# Patient Record
Sex: Male | Born: 1965 | Race: White | Hispanic: No | Marital: Married | Smoking: Never smoker
Health system: Southern US, Community
[De-identification: ages and names within clinical notes are randomized; demographics above are authoritative.]

## PROBLEM LIST (undated history)

## (undated) DIAGNOSIS — L719 Rosacea, unspecified: Secondary | ICD-10-CM

## (undated) DIAGNOSIS — E7800 Pure hypercholesterolemia, unspecified: Secondary | ICD-10-CM

## (undated) DIAGNOSIS — R002 Palpitations: Secondary | ICD-10-CM

## (undated) DIAGNOSIS — C44609 Unspecified malignant neoplasm of skin of left upper limb, including shoulder: Secondary | ICD-10-CM

## (undated) DIAGNOSIS — G4730 Sleep apnea, unspecified: Secondary | ICD-10-CM

## (undated) DIAGNOSIS — L649 Androgenic alopecia, unspecified: Secondary | ICD-10-CM

## (undated) DIAGNOSIS — J309 Allergic rhinitis, unspecified: Secondary | ICD-10-CM

## (undated) DIAGNOSIS — G4700 Insomnia, unspecified: Secondary | ICD-10-CM

## (undated) DIAGNOSIS — Z8669 Personal history of other diseases of the nervous system and sense organs: Secondary | ICD-10-CM

## (undated) DIAGNOSIS — M5412 Radiculopathy, cervical region: Secondary | ICD-10-CM

## (undated) DIAGNOSIS — R410 Disorientation, unspecified: Secondary | ICD-10-CM

## (undated) DIAGNOSIS — J302 Other seasonal allergic rhinitis: Secondary | ICD-10-CM

## (undated) HISTORY — DX: Radiculopathy, cervical region: M54.12

## (undated) HISTORY — DX: Other seasonal allergic rhinitis: J30.2

## (undated) HISTORY — DX: Palpitations: R00.2

## (undated) HISTORY — PX: COLONOSCOPY: SHX174

## (undated) HISTORY — DX: Allergic rhinitis, unspecified: J30.9

## (undated) HISTORY — DX: Sleep apnea, unspecified: G47.30

## (undated) HISTORY — DX: Personal history of other diseases of the nervous system and sense organs: Z86.69

## (undated) HISTORY — DX: Insomnia, unspecified: G47.00

## (undated) HISTORY — DX: Rosacea, unspecified: L71.9

## (undated) HISTORY — DX: Disorientation, unspecified: R41.0

## (undated) HISTORY — DX: Unspecified malignant neoplasm of skin of left upper limb, including shoulder: C44.609

## (undated) HISTORY — PX: MELANOMA EXCISION: SHX5266

## (undated) HISTORY — DX: Androgenic alopecia, unspecified: L64.9

## (undated) HISTORY — DX: Pure hypercholesterolemia, unspecified: E78.00

---

## 1986-10-06 HISTORY — PX: OTHER SURGICAL HISTORY: SHX169

## 2002-10-06 HISTORY — PX: LIPOMA EXCISION: SHX5283

## 2016-04-18 ENCOUNTER — Ambulatory Visit
Admission: RE | Admit: 2016-04-18 | Discharge: 2016-04-18 | Disposition: A | Discharge status: Home / Self Care | Payer: 59 | Source: Ambulatory Visit | Attending: Family Medicine | Admitting: Family Medicine

## 2016-04-18 ENCOUNTER — Other Ambulatory Visit: Payer: Self-pay | Admitting: Family Medicine

## 2016-04-18 DIAGNOSIS — M5442 Lumbago with sciatica, left side: Secondary | ICD-10-CM

## 2016-04-18 DIAGNOSIS — M79602 Pain in left arm: Secondary | ICD-10-CM

## 2017-09-05 DIAGNOSIS — C44609 Unspecified malignant neoplasm of skin of left upper limb, including shoulder: Secondary | ICD-10-CM

## 2017-09-05 HISTORY — DX: Unspecified malignant neoplasm of skin of left upper limb, including shoulder: C44.609

## 2017-10-06 HISTORY — PX: COLONOSCOPY: SHX174

## 2017-10-14 ENCOUNTER — Encounter: Payer: Self-pay | Admitting: Internal Medicine

## 2017-12-11 ENCOUNTER — Ambulatory Visit (AMBULATORY_SURGERY_CENTER): Payer: Self-pay

## 2017-12-11 ENCOUNTER — Other Ambulatory Visit: Payer: Self-pay

## 2017-12-11 VITALS — Ht 67.0 in | Wt 173.4 lb

## 2017-12-11 DIAGNOSIS — Z1211 Encounter for screening for malignant neoplasm of colon: Secondary | ICD-10-CM

## 2017-12-11 NOTE — Progress Notes (Signed)
Denies allergies to eggs or soy products. Denies complication of anesthesia or sedation. Denies use of weight loss medication. Denies use of O2.   Emmi instructions declined.  

## 2017-12-14 ENCOUNTER — Encounter: Payer: Self-pay | Admitting: Internal Medicine

## 2017-12-25 ENCOUNTER — Encounter: Payer: Self-pay | Admitting: Internal Medicine

## 2017-12-25 ENCOUNTER — Other Ambulatory Visit: Payer: Self-pay

## 2017-12-25 ENCOUNTER — Ambulatory Visit (AMBULATORY_SURGERY_CENTER): Payer: 59 | Admitting: Internal Medicine

## 2017-12-25 VITALS — BP 114/71 | HR 65 | Temp 96.9°F | Resp 11 | Ht 67.0 in | Wt 173.0 lb

## 2017-12-25 DIAGNOSIS — Z800 Family history of malignant neoplasm of digestive organs: Secondary | ICD-10-CM | POA: Diagnosis present

## 2017-12-25 DIAGNOSIS — Z1211 Encounter for screening for malignant neoplasm of colon: Secondary | ICD-10-CM

## 2017-12-25 MED ORDER — SODIUM CHLORIDE 0.9 % IV SOLN: 500.0000 mL | Freq: Once | INTRAVENOUS | Status: DC

## 2017-12-25 NOTE — Progress Notes (Signed)
To recovery, report to RN, VSS. 

## 2017-12-25 NOTE — Op Note (Signed)
[facility] Patient Name: Justin Curtis Procedure Date: 12/25/2017 1:32 PM MRN: [id number] Endoscopist: Gatha Mayer , MD Age: 52 Referring MD:  Date of Birth: 12-29-1965 Gender: Male Account #: [id number] Procedure:                Colonoscopy Indications:              Screening in patient at increased risk: Colorectal                            cancer in father 62 or older, and maternal                            grandfather >60 Medicines:                Propofol per Anesthesia, Monitored Anesthesia Care Procedure:                Pre-Anesthesia Assessment:                           - Prior to the procedure, a History and Physical                            was performed, and patient medications and                            allergies were reviewed. The patient's tolerance of                            previous anesthesia was also reviewed. The risks                            and benefits of the procedure and the sedation                            options and risks were discussed with the patient.                            All questions were answered, and informed consent                            was obtained. Prior Anticoagulants: The patient has                            taken no previous anticoagulant or antiplatelet                            agents. ASA Grade Assessment: II - A patient with                            mild systemic disease. After reviewing the risks                            and benefits, the patient was deemed in  satisfactory condition to undergo the procedure.                           After obtaining informed consent, the colonoscope                            was passed under direct vision. Throughout the                            procedure, the patient's blood pressure, pulse, and                            oxygen saturations were monitored continuously. The                            Colonoscope was introduced  through the anus and                            advanced to the the cecum, identified by                            appendiceal orifice and ileocecal valve. The                            colonoscopy was performed without difficulty. The                            patient tolerated the procedure well. The quality                            of the bowel preparation was excellent. The                            ileocecal valve, appendiceal orifice, and rectum                            were photographed. The bowel preparation used was                            Miralax. Scope In: 1:40:35 PM Scope Out: 1:52:21 PM Scope Withdrawal Time: 0 hours 7 minutes 38 seconds  Total Procedure Duration: 0 hours 11 minutes 46 seconds  Findings:                 The perianal and digital rectal examinations were                            normal. Pertinent negatives include normal prostate                            (size, shape, and consistency).                           The entire examined colon appeared normal on direct  and retroflexion views. Complications:            No immediate complications. Estimated Blood Loss:     Estimated blood loss: none. Impression:               - The entire examined colon is normal on direct and                            retroflexion views.                           - No specimens collected. Recommendation:           - Patient has a contact number available for                            emergencies. The signs and symptoms of potential                            delayed complications were discussed with the                            patient. Return to normal activities tomorrow.                            Written discharge instructions were provided to the                            patient.                           - Resume previous diet.                           - Continue present medications.                           - Repeat  colonoscopy in 5 years for screening                            purposes. Gatha Mayer, MD 12/25/2017 1:55:59 PM This report has been signed electronically.

## 2017-12-25 NOTE — Progress Notes (Signed)
Pt's states no medical or surgical changes since previsit or office visit. 

## 2017-12-25 NOTE — Patient Instructions (Addendum)
   Normal colonoscopy again!  Your next routine colonoscopy should be in 5 years - 2024.  I appreciate the opportunity to care for you. Gatha Mayer, MD, FACG   YOU HAD AN ENDOSCOPIC PROCEDURE TODAY AT [facility]:   Refer to the procedure report that was given to you for any specific questions about what was found during the examination.  If the procedure report does not answer your questions, please call your gastroenterologist to clarify.  If you requested that your care partner not be given the details of your procedure findings, then the procedure report has been included in a sealed envelope for you to review at your convenience later.  YOU SHOULD EXPECT: Some feelings of bloating in the abdomen. Passage of more gas than usual.  Walking can help get rid of the air that was put into your GI tract during the procedure and reduce the bloating. If you had a lower endoscopy (such as a colonoscopy or flexible sigmoidoscopy) you may notice spotting of blood in your stool or on the toilet paper. If you underwent a bowel prep for your procedure, you may not have a normal bowel movement for a few days.  Please Note:  You might notice some irritation and congestion in your nose or some drainage.  This is from the oxygen used during your procedure.  There is no need for concern and it should clear up in a day or so.  SYMPTOMS TO REPORT IMMEDIATELY:   Following lower endoscopy (colonoscopy or flexible sigmoidoscopy):  Excessive amounts of blood in the stool  Significant tenderness or worsening of abdominal pains  Swelling of the abdomen that is new, acute  Fever of 100F or higher    For urgent or emergent issues, a gastroenterologist can be reached at any hour by calling [phone].   DIET:  We do recommend a small meal at first, but then you may proceed to your regular diet.  Drink plenty of fluids but you should avoid alcoholic beverages for 24  hours.  ACTIVITY:  You should plan to take it easy for the rest of today and you should NOT DRIVE or use heavy machinery until tomorrow (because of the sedation medicines used during the test).    FOLLOW UP: Our staff will call the number listed on your records the next business day following your procedure to check on you and address any questions or concerns that you may have regarding the information given to you following your procedure. If we do not reach you, we will leave a message.  However, if you are feeling well and you are not experiencing any problems, there is no need to return our call.  We will assume that you have returned to your regular daily activities without incident.  If any biopsies were taken you will be contacted by phone or by letter within the next 1-3 weeks.  Please call us at [phone] if you have not heard about the biopsies in 3 weeks.    SIGNATURES/CONFIDENTIALITY: You and/or your care partner have signed paperwork which will be entered into your electronic medical record.  These signatures attest to the fact that that the information above on your After Visit Summary has been reviewed and is understood.  Full responsibility of the confidentiality of this discharge information lies with you and/or your care-partner.

## 2017-12-28 ENCOUNTER — Telehealth: Payer: Self-pay | Admitting: *Deleted

## 2017-12-28 ENCOUNTER — Telehealth: Payer: Self-pay

## 2017-12-28 NOTE — Telephone Encounter (Signed)
Left message

## 2017-12-28 NOTE — Telephone Encounter (Signed)
No answer for post procedure call back. Will attempt to call back later this afternoon. SM 

## 2020-07-19 ENCOUNTER — Other Ambulatory Visit: Payer: Self-pay

## 2020-07-19 ENCOUNTER — Emergency Department (HOSPITAL_BASED_OUTPATIENT_CLINIC_OR_DEPARTMENT_OTHER)
Admission: EM | Admit: 2020-07-19 | Discharge: 2020-07-19 | Disposition: A | Discharge status: Home / Self Care | Payer: 59 | Attending: Emergency Medicine | Admitting: Emergency Medicine

## 2020-07-19 ENCOUNTER — Encounter (HOSPITAL_BASED_OUTPATIENT_CLINIC_OR_DEPARTMENT_OTHER): Payer: Self-pay | Admitting: *Deleted

## 2020-07-19 DIAGNOSIS — S0181XA Laceration without foreign body of other part of head, initial encounter: Secondary | ICD-10-CM | POA: Diagnosis not present

## 2020-07-19 DIAGNOSIS — S0990XA Unspecified injury of head, initial encounter: Secondary | ICD-10-CM | POA: Diagnosis present

## 2020-07-19 DIAGNOSIS — Z85828 Personal history of other malignant neoplasm of skin: Secondary | ICD-10-CM | POA: Diagnosis not present

## 2020-07-19 DIAGNOSIS — W228XXA Striking against or struck by other objects, initial encounter: Secondary | ICD-10-CM | POA: Insufficient documentation

## 2020-07-19 NOTE — ED Triage Notes (Signed)
Skin avulsion to his forearm this am. Bleeding controlled. His MD wanted him to see if it could be sutured.

## 2020-07-19 NOTE — ED Provider Notes (Signed)
[name] Provider Note   CSN: [id number] Arrival date & time: 07/19/20  1234     History Chief Complaint  Patient presents with  . Laceration    Justin Curtis is a 54 y.o. male.  HPI Is a 54 year old male with a medical history as noted below.  He was seen by his PCP earlier today and was sent to the emergency department for possible sutures.  Patient states that he struck his forehead on the back door of his vehicle.  Reports a small avulsion to the right forehead.  No active bleeding.  Mild pain overlying the site.  No other complaints at this time.    Past Medical History:  Diagnosis Date  . Allergic rhinitis    since his teens  . 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 History of migraine headaches   . Hypercholesterolemia   . Insomnia   . Left cervical radiculopathy    intermittently since 2002  . Palpitations   . Rosacea   . Skin cancer of arm, left 09/2017   tricep area  . Transient disorientation     There are no problems to display for this patient.   Past Surgical History:  Procedure Laterality Date  . COLONOSCOPY    . left ACL repair  1988  . LIPOMA EXCISION  2004   on his back  . MELANOMA EXCISION Left    tricep area       Family History  Problem Relation Age of Onset  . Colon cancer Father 75  . Skin cancer Father   . Alzheimer's disease Father        Possibly   . Colon cancer Maternal Grandfather   . Hypertension Mother   . Gout Mother   . Hyperlipidemia Mother   . Skin cancer Mother   . Other Mother        renovascular disease  . Skin cancer Sister   . Sleep apnea Brother   . Depression Brother   . Esophageal cancer Neg Hx   . Liver cancer Neg Hx   . Pancreatic cancer Neg Hx   . Rectal cancer Neg Hx     Social History   Tobacco Use  . Smoking status: Never Smoker  . Smokeless tobacco: Never Used  Vaping Use  . Vaping Use: Never used  Substance Use Topics  . Alcohol use: Yes    Comment: occasional  wine or beer.  . Drug use: No    Home Medications Prior to Admission medications   Medication Sig Start Date End Date Taking? Authorizing Provider  ibuprofen (ADVIL,MOTRIN) 400 MG tablet Take 400 mg by mouth every 6 (six) hours as needed.    [provider]  loratadine (CLARITIN) 10 MG tablet Take 10 mg by mouth daily.    [provider]  Pseudoephedrine-Ibuprofen 30-200 MG TABS Take 1 tablet by mouth as needed.    [provider]    Allergies    Patient has no known allergies.  Review of Systems   Review of Systems  Skin: Positive for wound.  Neurological: Negative for syncope.  Psychiatric/Behavioral: Negative for confusion.   Physical Exam Updated Vital Signs BP 127/85   Pulse 71   Temp 98.5 F (36.9 C) (Oral)   Resp 16   Ht 5\' 7"  (1.702 m)   Wt 65.8 kg   SpO2 98%   BMI 22.71 kg/m   Physical Exam Vitals and nursing note reviewed.  Constitutional:  General: He is not in acute distress.    Appearance: He is well-developed.  HENT:     Head: Normocephalic and atraumatic.     Right Ear: External ear normal.     Left Ear: External ear normal.  Eyes:     General: No scleral icterus.       Right eye: No discharge.        Left eye: No discharge.     Conjunctiva/sclera: Conjunctivae normal.  Neck:     Trachea: No tracheal deviation.  Cardiovascular:     Rate and Rhythm: Normal rate.  Pulmonary:     Effort: Pulmonary effort is normal. No respiratory distress.     Breath sounds: No stridor.  Abdominal:     General: There is no distension.  Musculoskeletal:        General: No swelling or deformity.     Cervical back: Neck supple.  Skin:    General: Skin is warm and dry.     Findings: Wound present. No rash.     Comments: Small skin avulsion to the right forehead.  No active bleeding.  Neurological:     Mental Status: He is alert.     Cranial Nerves: Cranial nerve deficit: no gross deficits.    ED Results / Procedures /  Treatments   Labs (all labs ordered are listed, but only abnormal results are displayed) Labs Reviewed - No data to display  EKG None  Radiology No results found.  Procedures Procedures (including critical care time)  Medications Ordered in ED Medications - No data to display  ED Course  I have reviewed the triage vital signs and the nursing notes.  Pertinent labs & imaging results that were available during my care of the patient were reviewed by me and considered in my medical decision making (see chart for details).    MDM Rules/Calculators/A&P                          Patient presents today with a small avulsion laceration to the right forehead.  He was seen by his primary care provider earlier today who recommended that he come to the emergency department for possible sutures.  Given the nature of the wound, do not feel that suturing is appropriate.  We will have nursing staff place bacitracin on the site and a fresh bandage.  Discussed tissue adhesive with patient and both he and I do not feel this is necessary.  Discussed wound care with the patient.  He is going to follow-up with his PCP in 1-1/2 weeks at his regularly scheduled appointment.    His questions were answered and he was amicable at the time of discharge.  His vital signs are stable.  Final Clinical Impression(s) / ED Diagnoses Final diagnoses:  Laceration of forehead, initial encounter   Rx / DC Orders ED Discharge Orders    None       Rayna Sexton, PA-C 07/19/20 1419    Quintella Reichert, MD 07/19/20 1441

## 2020-09-03 ENCOUNTER — Encounter (HOSPITAL_BASED_OUTPATIENT_CLINIC_OR_DEPARTMENT_OTHER): Payer: Self-pay

## 2020-09-03 DIAGNOSIS — R400 Somnolence: Secondary | ICD-10-CM

## 2020-10-12 ENCOUNTER — Encounter (HOSPITAL_BASED_OUTPATIENT_CLINIC_OR_DEPARTMENT_OTHER): Payer: 59 | Admitting: Internal Medicine

## 2020-10-17 ENCOUNTER — Ambulatory Visit (HOSPITAL_BASED_OUTPATIENT_CLINIC_OR_DEPARTMENT_OTHER): Payer: 59 | Attending: Internal Medicine | Admitting: Internal Medicine

## 2020-10-17 ENCOUNTER — Other Ambulatory Visit: Payer: Self-pay

## 2020-10-17 DIAGNOSIS — G4733 Obstructive sleep apnea (adult) (pediatric): Secondary | ICD-10-CM | POA: Diagnosis not present

## 2020-10-17 DIAGNOSIS — R400 Somnolence: Secondary | ICD-10-CM

## 2020-10-31 DIAGNOSIS — R400 Somnolence: Secondary | ICD-10-CM | POA: Diagnosis not present

## 2020-10-31 NOTE — Procedures (Signed)
   Patient Name: Justin Curtis, [name] Date: 10/17/2020 Gender: Male D.O.B: September 21, 1966 Age (years): 54 Referring Provider: Derinda Late Height (inches): 60 Interpreting Physician: Baird Lyons MD, ABSM Weight (lbs): 150 RPSGT: Jacolyn Reedy BMI: 23 MRN: [id number] Neck Size: 16.00  CLINICAL INFORMATION Sleep Study Type: HST Indication for sleep study: OSA Epworth Sleepiness Score: 19  SLEEP STUDY TECHNIQUE A multi-channel overnight portable sleep study was performed. The channels recorded were: nasal airflow, thoracic respiratory movement, and oxygen saturation with a pulse oximetry. Snoring was also monitored.  MEDICATIONS Patient self administered medications include: none reported  SLEEP ARCHITECTURE Patient was studied for 432.2 minutes. The sleep efficiency was 100.0 % and the patient was supine for 86.9%. The arousal index was 0.0 per hour.  RESPIRATORY PARAMETERS The overall AHI was 34.0 per hour, with a central apnea index of 0.0 per hour. The oxygen nadir was 83% during sleep.  CARDIAC DATA Mean heart rate during sleep was 73.3 bpm.  IMPRESSIONS - Severe obstructive sleep apnea occurred during this study (AHI = 34.0/h). - No significant central sleep apnea occurred during this study (CAI = 0.0/h). - Oxygen desaturation was noted during this study (Min O2 = 83%). Mean O2 saturation was 94%. - Patient snored.  DIAGNOSIS - Obstructive Sleep Apnea (G47.33)  RECOMMENDATIONS - Suggest CPAP titration sleep study or autopap. Other options would be based on clinical judgment. - Be careful with alcohol, sedatives and other CNS depressants that may worsen sleep apnea and disrupt normal sleep architecture. - Sleep hygiene should be reviewed to assess factors that may improve sleep quality. - Weight management and regular exercise should be initiated or continued.  [Electronically signed] 10/31/2020 01:43 PM  Baird Lyons MD, [place], American Board of  Sleep Medicine   NPI: [id number]                          [place], [place] [facility]  ELECTRONICALLY SIGNED ON:  10/31/2020, 1:41 PM [place] PH: [phone]   FX: [phone] [place]

## 2020-11-07 ENCOUNTER — Institutional Professional Consult (permissible substitution): Payer: 59 | Admitting: Pulmonary Disease

## 2020-12-04 ENCOUNTER — Other Ambulatory Visit: Payer: Self-pay

## 2020-12-04 ENCOUNTER — Encounter: Payer: Self-pay | Admitting: Pulmonary Disease

## 2020-12-04 ENCOUNTER — Ambulatory Visit (INDEPENDENT_AMBULATORY_CARE_PROVIDER_SITE_OTHER): Payer: 59 | Admitting: Pulmonary Disease

## 2020-12-04 DIAGNOSIS — G4733 Obstructive sleep apnea (adult) (pediatric): Secondary | ICD-10-CM | POA: Insufficient documentation

## 2020-12-04 NOTE — Progress Notes (Signed)
Subjective:    Patient ID: Justin Curtis, male    DOB: 1966-01-18, 55 y.o.   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  There are no witnessed apneas.  For the past year and a half he has been taking Xanax 0.5 mg at bedtime to help him with sleep and stress at work.  He tried melatonin in the past which did not work and Ambien seem to last longer and he once got off a plane from [place] and does not remember the ride home.  Epworth sleepiness score is 19 and reports sleepiness while sitting and reading, watching TV, in meetings, as a passenger in a car or lying down to rest in the afternoons. Bedtime is around 11 PM, sleep latency is minutes after he is taking his medication, he sleeps on his right side with 1 pillow, denies nocturnal awakenings and is out of bed by 5 AM feeling rested without dryness of mouth or headaches.  He started exercising and has lost 30 pounds in the last 2 years There is no history suggestive of cataplexy, sleep paralysis or parasomnias  PMH -hyperlipidemia, chronic headaches, allergic rhinitis    Significant tests/ events reviewed  HST 10/2020 TST 7h, supine AHI 38/hour nonsupine AHI 6/hour, total AHI 34/hour   Past Medical History:  Diagnosis Date  . Allergic rhinitis    since his teens  . 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 History of migraine headaches   . Hypercholesterolemia   . Insomnia   . Left cervical radiculopathy    intermittently since 2002  . Palpitations   . Rosacea   . Skin cancer of arm, left 09/2017   tricep area  . Transient disorientation    Past Surgical History:  Procedure Laterality Date  . COLONOSCOPY    . left ACL repair  1988  . LIPOMA EXCISION  2004   on his back  . MELANOMA EXCISION Left    tricep area    No Known Allergies  Social  History   Socioeconomic History  . Marital status: Married    Spouse name: Not on file  . Number of children: Not on file  . Years of education: Not on file  . Highest education level: Not on file  Occupational History  . Not on file  Tobacco Use  . Smoking status: Never Smoker  . Smokeless tobacco: Never Used  Vaping Use  . Vaping Use: Never used  Substance and Sexual Activity  . Alcohol use: Yes    Comment: occasional wine or beer.  . Drug use: No  . Sexual activity: Not on file  Other Topics Concern  . Not on file  Social History Narrative  . Not on file   Social Determinants of Health   Financial Resource Strain: Not on file  Food Insecurity: Not on file  Transportation Needs: Not on file  Physical Activity: Not on file  Stress: Not on file  Social Connections: Not on file  Intimate Partner Violence: Not on file    Family History  Problem Relation Age of Onset  . Colon cancer Father 62  . Skin cancer Father   . Alzheimer's disease Father        Possibly   . Colon cancer Maternal Grandfather   . Hypertension Mother   . Gout  Mother   . Hyperlipidemia Mother   . Skin cancer Mother   . Other Mother        renovascular disease  . Skin cancer Sister   . Sleep apnea Brother   . Depression Brother   . Esophageal cancer Neg Hx   . Liver cancer Neg Hx   . Pancreatic cancer Neg Hx   . Rectal cancer Neg Hx       Review of Systems Constitutional: negative for anorexia, fevers and sweats  Eyes: negative for irritation, redness and visual disturbance  Ears, nose, mouth, throat, and face: negative for earaches, epistaxis, nasal congestion and sore throat  Respiratory: negative for cough, dyspnea on exertion, sputum and wheezing  Cardiovascular: negative for chest pain, dyspnea, lower extremity edema, orthopnea, palpitations and syncope  Gastrointestinal: negative for abdominal pain, constipation, diarrhea, melena, nausea and vomiting  Genitourinary:negative for  dysuria, frequency and hematuria  Hematologic/lymphatic: negative for bleeding, easy bruising and lymphadenopathy  Musculoskeletal:negative for arthralgias, muscle weakness and stiff joints  Neurological: negative for coordination problems, gait problems, headaches and weakness  Endocrine: negative for diabetic symptoms including polydipsia, polyuria and weight loss     Objective:   Physical Exam  Gen. Pleasant, well-nourished, in no distress, normal affect ENT - no pallor,icterus, no post nasal drip, class 2 airway Neck: No JVD, no thyromegaly, no carotid bruits Lungs: no use of accessory muscles, no dullness to percussion, clear without rales or rhonchi  Cardiovascular: Rhythm regular, heart sounds  normal, no murmurs or gallops, no peripheral edema Abdomen: soft and non-tender, no hepatosplenomegaly, BS normal. Musculoskeletal: No deformities, no cyanosis or clubbing Neuro:  alert, non focal       Assessment & Plan:

## 2020-12-04 NOTE — Assessment & Plan Note (Signed)
We discussed that you have severe obstructive sleep apnea in the supine position and very mild on your side -dramatic difference, supine AHI 38/hour, nonsupine AHI 6/hour, total AHI 34/on the study with predominant supine sleep Try to avoid sleeping on your back  You can cut the tennis ball in half and put it under the covers or you can trial a positional device called zzOma  Call me back in 3 months to report. If you are better, then we will proceed with home sleep test with wearing the device. If you are no better, then I would suggest trial of CPAP therapy with nasal pillows Alternatively, we discussed having a dentist for treated with a mouthguard/oral appliance for sleep apnea   We also discussed avoiding alcohol 3 hours before bedtime.  Preferably Xanax should be tapered off and can use alternative hypnotic such as Sonata which is not long-lasting.  He has not tolerated Ambien in the past

## 2020-12-04 NOTE — Patient Instructions (Signed)
  We discussed that you have severe obstructive sleep apnea in the supine position and very mild on your side Try to avoid sleeping on your back  You can cut the tennis ball in half and put it under the covers or you can trial a positional device called zzOma  Call me back in 3 months to report. If you are better, then we will proceed with home sleep test with wearing the device. If you are no better, then I would suggest trial of CPAP therapy with nasal pillows Alternatively, we discussed having a dentist for treated with a mouthguard/oral appliance for sleep apnea

## 2021-08-27 ENCOUNTER — Other Ambulatory Visit: Payer: Self-pay

## 2021-08-27 ENCOUNTER — Other Ambulatory Visit: Payer: Self-pay | Admitting: Family Medicine

## 2021-08-27 ENCOUNTER — Ambulatory Visit
Admission: RE | Admit: 2021-08-27 | Discharge: 2021-08-27 | Disposition: A | Discharge status: Home / Self Care | Payer: BC Managed Care – PPO | Source: Ambulatory Visit | Attending: Family Medicine | Admitting: Family Medicine

## 2021-08-27 DIAGNOSIS — M79641 Pain in right hand: Secondary | ICD-10-CM

## 2021-10-08 ENCOUNTER — Other Ambulatory Visit (HOSPITAL_COMMUNITY): Payer: Self-pay | Admitting: Family Medicine

## 2021-10-21 ENCOUNTER — Other Ambulatory Visit: Payer: Self-pay

## 2021-10-21 ENCOUNTER — Ambulatory Visit (HOSPITAL_BASED_OUTPATIENT_CLINIC_OR_DEPARTMENT_OTHER)
Admission: RE | Admit: 2021-10-21 | Discharge: 2021-10-21 | Disposition: A | Discharge status: Home / Self Care | Payer: BC Managed Care – PPO | Source: Ambulatory Visit | Attending: Family Medicine | Admitting: Family Medicine

## 2021-10-21 DIAGNOSIS — E782 Mixed hyperlipidemia: Secondary | ICD-10-CM | POA: Insufficient documentation

## 2021-10-21 DIAGNOSIS — I700 Atherosclerosis of aorta: Secondary | ICD-10-CM | POA: Insufficient documentation

## 2021-10-21 DIAGNOSIS — Z136 Encounter for screening for cardiovascular disorders: Secondary | ICD-10-CM | POA: Insufficient documentation

## 2022-05-26 ENCOUNTER — Other Ambulatory Visit: Payer: Self-pay | Admitting: Family Medicine

## 2022-05-26 ENCOUNTER — Ambulatory Visit
Admission: RE | Admit: 2022-05-26 | Discharge: 2022-05-26 | Disposition: A | Discharge status: Home / Self Care | Payer: BC Managed Care – PPO | Source: Ambulatory Visit | Attending: Family Medicine | Admitting: Family Medicine

## 2022-05-26 DIAGNOSIS — T148XXA Other injury of unspecified body region, initial encounter: Secondary | ICD-10-CM

## 2022-07-07 ENCOUNTER — Ambulatory Visit
Admission: RE | Admit: 2022-07-07 | Discharge: 2022-07-07 | Disposition: A | Discharge status: Home / Self Care | Payer: BC Managed Care – PPO | Source: Ambulatory Visit | Attending: Family Medicine | Admitting: Family Medicine

## 2022-07-07 ENCOUNTER — Other Ambulatory Visit: Payer: Self-pay | Admitting: Family Medicine

## 2022-07-07 DIAGNOSIS — R52 Pain, unspecified: Secondary | ICD-10-CM

## 2023-02-27 ENCOUNTER — Encounter: Payer: Self-pay | Admitting: Internal Medicine

## 2023-04-13 ENCOUNTER — Encounter: Payer: Self-pay | Admitting: Internal Medicine

## 2023-04-17 IMAGING — CT CT CARDIAC CORONARY ARTERY CALCIUM SCORE
3 series · 14 of 20 positions shown, 16 images · non-contrast
Comparison: None.
COMPARISON: None.

Addendum:
EXAM:
OVER-READ INTERPRETATION  CT CHEST

The following report is an over-read performed by radiologist Dr.
Chato Lorenzen [REDACTED] on 10/21/2021. This
over-read does not include interpretation of cardiac or coronary
anatomy or pathology. The coronary calcium score interpretation by
the cardiologist is attached.
CLINICAL DATA: Risk stratification
Coronary Calcium Score
TECHNIQUE: The patient was scanned on a Siemens Somatom 64 slice scanner. Axial
non-contrast 3 mm slices were carried out through the heart. The
data set was analyzed on a dedicated work station and scored using
the Agatson method.

[Series 2: ax lung · axial · 0.71mm/px · z∈[+20,+132]mm · 5 of 86 slices shown]
[im 15/86  lung]
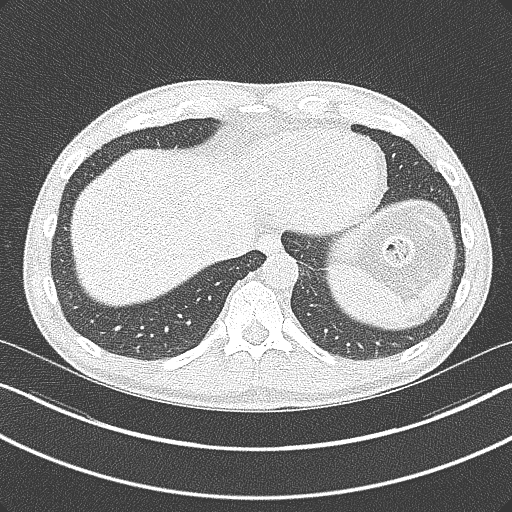
[im 29/86  lung]
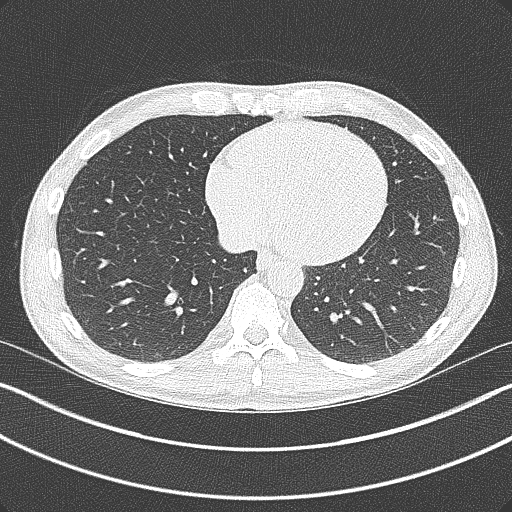
[im 43/86  lung]
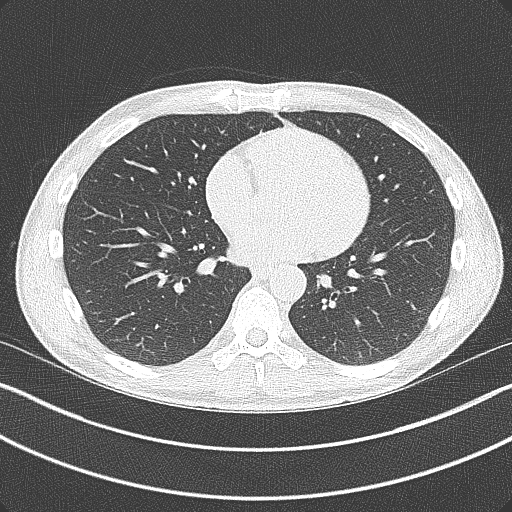
[im 57/86  lung]
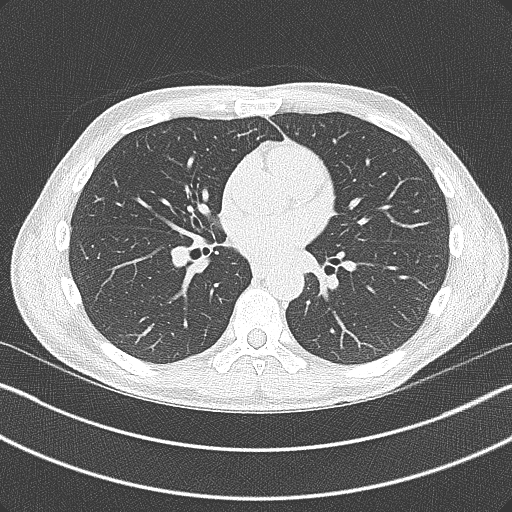
[im 71/86  lung]
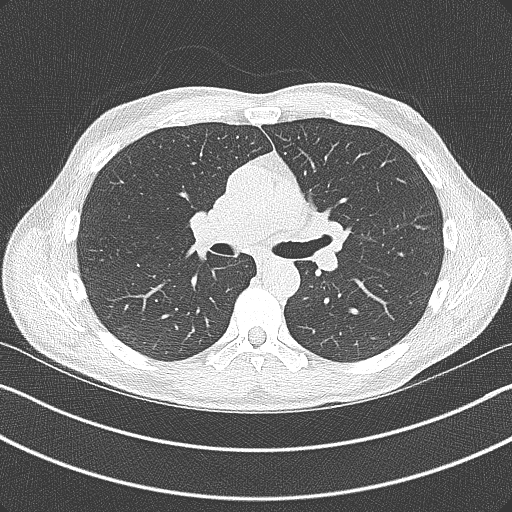

[Series 3: cascseq 3.0 sa36 70% (id) · axial · 0.39mm/px · z∈[+36,+120]mm · 3 of 57 slices shown]
[im 15/57  vessel]
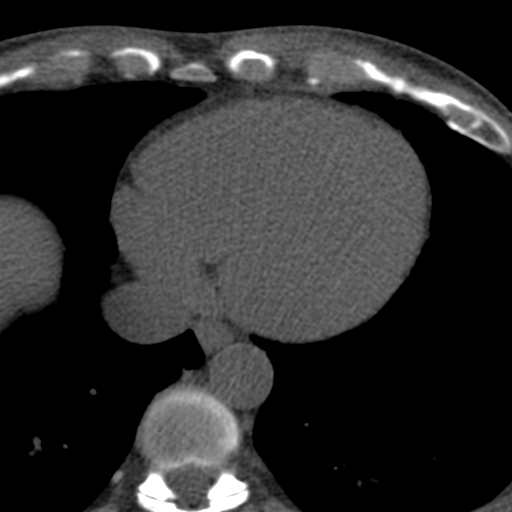
[im 29/57  vessel]
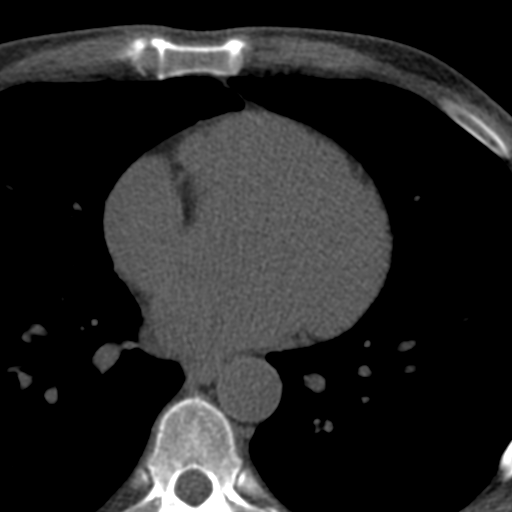
[im 43/57  vessel]
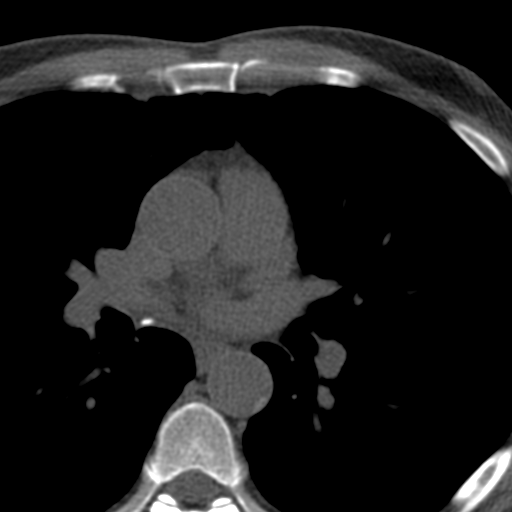

[Series 4: ax st · axial · 0.71mm/px · z∈[+16,+136]mm · 6 of 86 slices shown, 8 images]
[im 13/86  vessel]
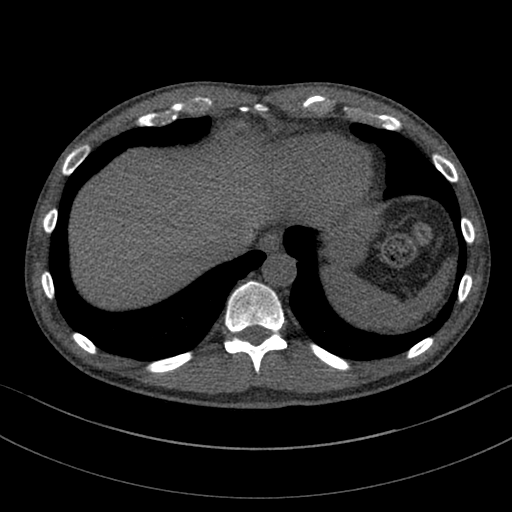
[im 13/86  lung]
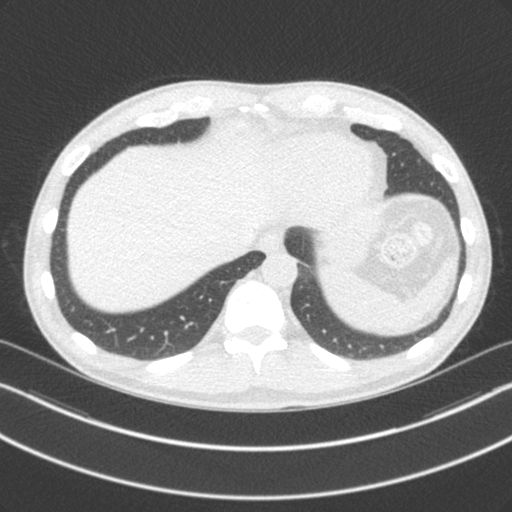
[im 25/86  vessel]
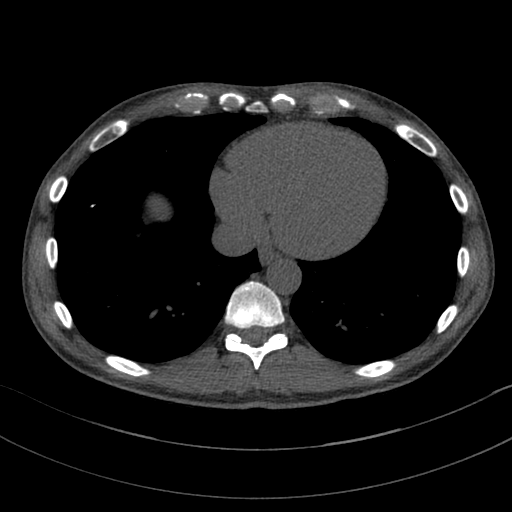
[im 37/86  vessel]
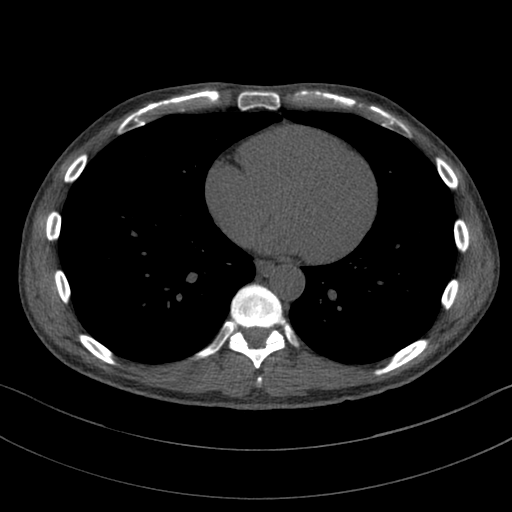
[im 49/86  vessel]
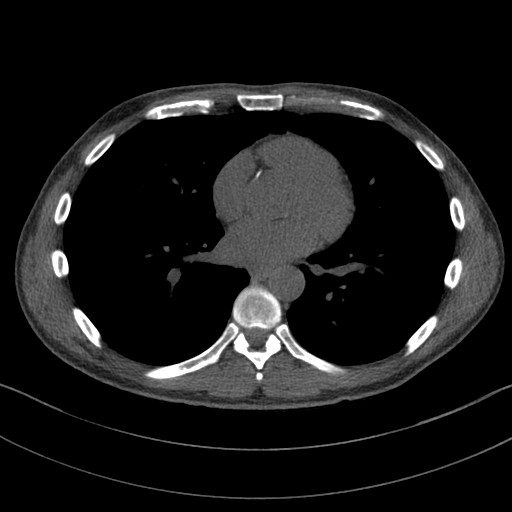
[im 61/86  vessel]
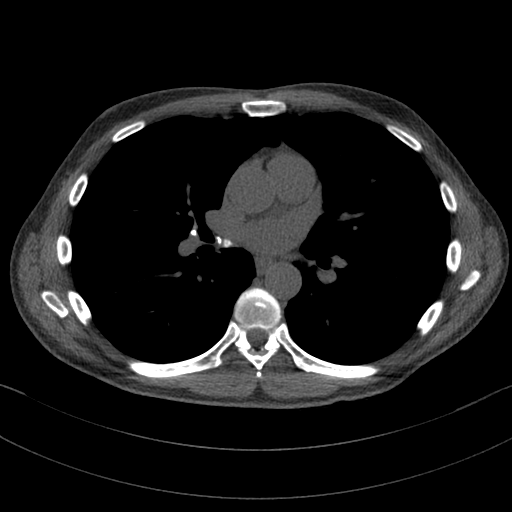
[im 61/86  lung]
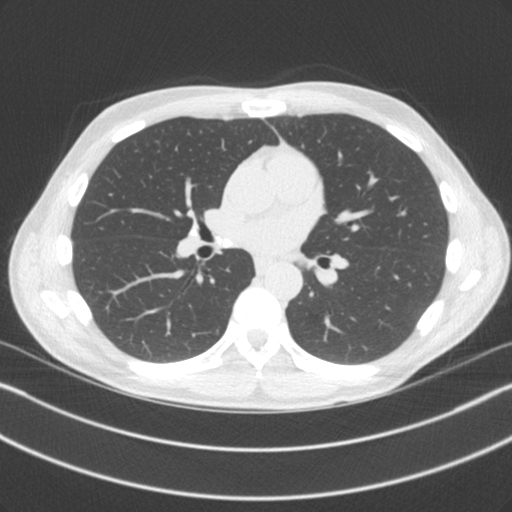
[im 73/86  vessel]
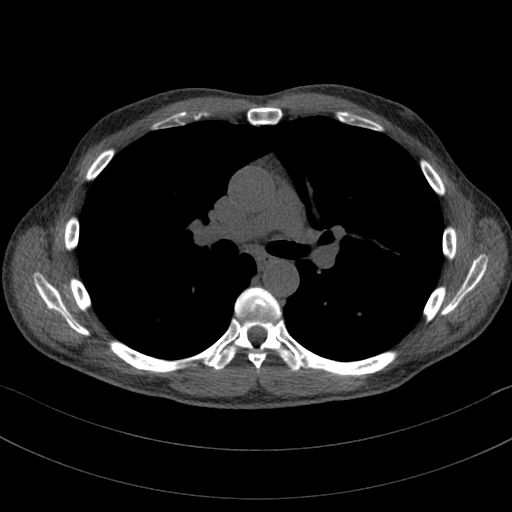

[14 of 20 positions shown; findings below may reference images not displayed]

FINDINGS: Vascular: Mild calcifications at the level of the aortic root.

Mediastinum/Nodes: Calcified right hilar lymph nodes are consistent
with prior granulomatous disease.

Lungs/Pleura: Densely calcified granuloma in the right lower lobe on
image 63. Visualized lungs show no evidence of pulmonary edema,
consolidation, pneumothorax or pleural fluid.

Upper Abdomen: No acute abnormality.

Musculoskeletal: No chest wall mass or suspicious bone lesions
identified.
IMPRESSION: 1. Mild atherosclerosis of the thoracic aorta at the level of the
aortic root.
2. Evidence of prior granulomatous disease with calcified right
lower lobe granuloma and calcified right hilar lymph nodes.
FINDINGS: Non-cardiac: See separate report from [REDACTED].

Ascending aorta: Normal diameter 3.3 cm

Pericardium: Normal

Coronary arteries: No calcium noted
IMPRESSION: Coronary calcium score of 0. Some aortic atherosclerosis in the
aortic Rudi

Jensen Dejesus

*** End of Addendum ***
EXAM:
OVER-READ INTERPRETATION  CT CHEST

The following report is an over-read performed by radiologist Dr.
Chato Lorenzen [REDACTED] on 10/21/2021. This
over-read does not include interpretation of cardiac or coronary
anatomy or pathology. The coronary calcium score interpretation by
the cardiologist is attached.
FINDINGS: Vascular: Mild calcifications at the level of the aortic root.

Mediastinum/Nodes: Calcified right hilar lymph nodes are consistent
with prior granulomatous disease.

Lungs/Pleura: Densely calcified granuloma in the right lower lobe on
image 63. Visualized lungs show no evidence of pulmonary edema,
consolidation, pneumothorax or pleural fluid.

Upper Abdomen: No acute abnormality.

Musculoskeletal: No chest wall mass or suspicious bone lesions
identified.
IMPRESSION: 1. Mild atherosclerosis of the thoracic aorta at the level of the
aortic root.
2. Evidence of prior granulomatous disease with calcified right
lower lobe granuloma and calcified right hilar lymph nodes.

## 2023-05-13 ENCOUNTER — Ambulatory Visit (AMBULATORY_SURGERY_CENTER): Payer: BC Managed Care – PPO

## 2023-05-13 VITALS — Ht 67.0 in | Wt 145.0 lb

## 2023-05-13 DIAGNOSIS — Z800 Family history of malignant neoplasm of digestive organs: Secondary | ICD-10-CM

## 2023-05-13 NOTE — Progress Notes (Signed)
Pre visit completed via phone call; Patient verified name, DOB, and address;  No egg or soy allergy known to patient;  No issues known to pt with past sedation with any surgeries or procedures; Patient denies ever being told they had issues or difficulty with intubation;  No FH of Malignant Hyperthermia; Pt is not on diet pills; Pt is not on home 02;  Pt is not on blood thinners;  Pt denies issues with constipation;  No A fib or A flutter  Have any cardiac testing pending--NO Insurance verified during PV appt--- BCBS  Pt can ambulate without assistance;  Pt denies use of chewing tobacco; Discussed diabetic/weight loss medication holds; Discussed NSAID holds; Checked BMI to be less than 50; Pt instructed to use Single[employer] or GoodRx for a price reduction on prep  Patient's chart reviewed by Cathlyn Parsons CNRA prior to previsit and patient appropriate for the [facility].  Pre visit completed and red dot placed by patient's name on their procedure day (on provider's schedule).    Instructions sent to patient via mail per his request;

## 2023-05-26 ENCOUNTER — Encounter: Payer: Self-pay | Admitting: Internal Medicine

## 2023-06-04 NOTE — Progress Notes (Signed)
[facility] History and Physical   Primary Care Physician:  Mosetta Putt, MD   Reason for Procedure:   Family Hx CRCA  Plan:    colonoscopy     HPI: Justin Curtis is a 57 y.o. 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 Authorizing Provider  ALPRAZolam Prudy Feeler) 0.5 MG tablet Take 0.25-0.5 mg by mouth at bedtime as needed. 11/28/20   [provider]  Azelaic Acid 15 % cream Apply topically 2 (two) times daily. 08/03/20   [provider]  Eszopiclone 3 MG TABS Take 3 mg by mouth at bedtime as needed. 04/02/23   [provider]  finasteride (PROSCAR) 5 MG tablet Take 5 mg by mouth daily. 11/28/20   [provider]  ibuprofen (ADVIL,MOTRIN) 400 MG tablet Take 400 mg by mouth every 6 (six) hours as needed.    [provider]  loratadine (CLARITIN) 10 MG tablet Take 10 mg by mouth daily.    [provider]    Current Outpatient Medications  Medication Sig Dispense Refill   ALPRAZolam (XANAX) 0.5 MG tablet Take 0.25-0.5 mg by mouth at bedtime as needed.     Azelaic Acid 15 % cream Apply topically 2 (two) times daily.     Eszopiclone 3 MG TABS Take 3 mg by mouth at bedtime as needed.     finasteride  (PROSCAR) 5 MG tablet Take 5 mg by mouth daily.     loratadine (CLARITIN) 10 MG tablet Take 10 mg by mouth daily.     ibuprofen (ADVIL,MOTRIN) 400 MG tablet Take 400 mg by mouth every 6 (six) hours as needed.     Current Facility-Administered Medications  Medication Dose Route Frequency Provider Last Rate Last Admin   0.9 %  sodium chloride infusion  500 mL Intravenous Once Iva Boop, MD        Allergies as of 06/05/2023   (No Known Allergies)    Family History  Problem Relation Age of Onset   Hypertension Mother    Gout Mother    Hyperlipidemia Mother    Skin cancer Mother    Other Mother        renovascular disease   Colon polyps Father 49   Colon cancer Father 62   Skin cancer Father    Alzheimer's disease Father        Possibly    Skin cancer Sister    Sleep apnea Brother    Depression Brother    Colon cancer Maternal Grandfather    Esophageal cancer Neg Hx    Liver  cancer Neg Hx    Pancreatic cancer Neg Hx    Rectal cancer Neg Hx    Stomach cancer Neg Hx     Social History   Socioeconomic History   Marital status: Married    Spouse name: Not on file   Number of children: Not on file   Years of education: Not on file   Highest education level: Not on file  Occupational History   Not on file  Tobacco Use   Smoking status: Never   Smokeless tobacco: Never  Vaping Use   Vaping status: Never Used  Substance and Sexual Activity   Alcohol use: Yes    Alcohol/week: 10.0 standard drinks of alcohol    Types: 10 Standard drinks or equivalent per week    Comment: occasional wine or beer.   Drug use: No   Sexual activity: Not on file  Other Topics Concern   Not on file  Social History Narrative   Not on file   Social Determinants of Health   Financial Resource Strain: Not on file  Food Insecurity: Not on file  Transportation Needs: Not on file  Physical Activity: Not on file  Stress: Not on file  Social Connections: Not on file  Intimate Partner  Violence: Not on file    Review of Systems:  All other review of systems negative except as mentioned in the HPI.  Physical Exam: Vital signs BP 117/84   Pulse 71   Temp (!) 97.3 F (36.3 C) (Skin)   Ht 5\' 7"  (1.702 m)   Wt 145 lb (65.8 kg)   SpO2 98%   BMI 22.71 kg/m   General:   Alert,  Well-developed, well-nourished, pleasant and cooperative in NAD Lungs:  Clear throughout to auscultation.   Heart:  Regular rate and rhythm; no murmurs, clicks, rubs,  or gallops. Abdomen:  Soft, nontender and nondistended. Normal bowel sounds.   Neuro/Psych:  Alert and cooperative. Normal mood and affect. A and O x 3   @Corean Yoshimura  Sena Slate, MD, [facility] [phone] (pager) 06/05/2023 8:40 AM@

## 2023-06-05 ENCOUNTER — Encounter: Payer: Self-pay | Admitting: Internal Medicine

## 2023-06-05 ENCOUNTER — Ambulatory Visit (AMBULATORY_SURGERY_CENTER): Payer: BC Managed Care – PPO | Admitting: Internal Medicine

## 2023-06-05 VITALS — BP 116/79 | HR 71 | Temp 97.3°F | Resp 11 | Ht 67.0 in | Wt 145.0 lb

## 2023-06-05 DIAGNOSIS — Z1211 Encounter for screening for malignant neoplasm of colon: Secondary | ICD-10-CM

## 2023-06-05 DIAGNOSIS — Z800 Family history of malignant neoplasm of digestive organs: Secondary | ICD-10-CM | POA: Diagnosis not present

## 2023-06-05 DIAGNOSIS — D125 Benign neoplasm of sigmoid colon: Secondary | ICD-10-CM

## 2023-06-05 MED ORDER — SODIUM CHLORIDE 0.9 % IV SOLN: 500.0000 mL | Freq: Once | INTRAVENOUS | Status: DC

## 2023-06-05 NOTE — Patient Instructions (Addendum)
Recommendation:           - Patient has a contact number available for                            emergencies. The signs and symptoms of potential                            delayed complications were discussed with the                            patient. Return to normal activities tomorrow.                            Written discharge instructions were provided to the                            patient.                           - Resume previous diet.                           - Continue present medications.                           - Await pathology results.                           - Repeat colonoscopy is recommended. The                            colonoscopy date will be determined after pathology                            results from today's exam become available for                            review.  YOU HAD AN ENDOSCOPIC PROCEDURE TODAY AT THE [facility]:   Refer to the procedure report that was given to you for any specific questions about what was found during the examination.  If the procedure report does not answer your questions, please call your gastroenterologist to clarify.  If you requested that your care partner not be given the details of your procedure findings, then the procedure report has been included in a sealed envelope for you to review at your convenience later.  YOU SHOULD EXPECT: Some feelings of bloating in the abdomen. Passage of more gas than usual.  Walking can help get rid of the air that was put into your GI tract during the procedure and reduce the bloating. If you had a lower endoscopy (such as a colonoscopy or flexible sigmoidoscopy) you may notice spotting of blood in your stool or on the toilet paper. If you underwent a bowel prep for your procedure, you may not have a normal bowel movement for a few days.  Please Note:  You might notice some irritation and congestion in your nose or some drainage.  This is from the oxygen used during  your procedure.  There is no need for concern and it should clear up in a day or so.  SYMPTOMS TO REPORT IMMEDIATELY:  Following lower endoscopy (colonoscopy or flexible sigmoidoscopy):  Excessive amounts of blood in the stool  Significant tenderness or worsening of abdominal pains  Swelling of the abdomen that is new, acute  Fever of 100F or higher  Handout on polyps given.  For urgent or emergent issues, a gastroenterologist can be reached at any hour by calling [phone]. Do not use MyChart messaging for urgent concerns.    DIET:  We do recommend a small meal at first, but then you may proceed to your regular diet.  Drink plenty of fluids but you should avoid alcoholic beverages for 24 hours.  ACTIVITY:  You should plan to take it easy for the rest of today and you should NOT DRIVE or use heavy machinery until tomorrow (because of the sedation medicines used during the test).    FOLLOW UP: Our staff will call the number listed on your records the next business day following your procedure.  We will call around 7:15- 8:00 am to check on you and address any questions or concerns that you may have regarding the information given to you following your procedure. If we do not reach you, we will leave a message.     If any biopsies were taken you will be contacted by phone or by letter within the next 1-3 weeks.  Please call us at [phone] if you have not heard about the biopsies in 3 weeks.    SIGNATURES/CONFIDENTIALITY: You and/or your care partner have signed paperwork which will be entered into your electronic medical record.  These signatures attest to the fact that that the information above on your After Visit Summary has been reviewed and is understood.  Full responsibility of the confidentiality of this discharge information lies with you and/or your care-partner.I did find and remove one tiny polyp today. I will let you know pathology results and when to have another  routine colonoscopy by mail and/or My Chart.  Anticipate repeat exam in 5 years.  I appreciate the opportunity to care for you. Iva Boop, MD, [facility]

## 2023-06-05 NOTE — Op Note (Signed)
[facility] Patient Name: Justin Curtis Procedure Date: 06/05/2023 8:42 AM MRN: [id number] Endoscopist: Iva Boop , MD, [id number] Age: 57 Referring MD:  Date of Birth: 10/27/1965 Gender: Male Account #: [id number] Procedure:                Colonoscopy Indications:              Screening in patient at increased risk: Family                            history of 1st-degree relative with colorectal                            cancer Father at 59 + maternal grandfather Medicines:                Monitored Anesthesia Care Procedure:                Pre-Anesthesia Assessment:                           - Prior to the procedure, a History and Physical                            was performed, and patient medications and                            allergies were reviewed. The patient's tolerance of                            previous anesthesia was also reviewed. The risks                            and benefits of the procedure and the sedation                            options and risks were discussed with the patient.                            All questions were answered, and informed consent                            was obtained. Prior Anticoagulants: The patient has                            taken no anticoagulant or antiplatelet agents. ASA                            Grade Assessment: II - A patient with mild systemic                            disease. After reviewing the risks and benefits,                            the patient was deemed in satisfactory condition to  undergo the procedure.                           After obtaining informed consent, the colonoscope                            was passed under direct vision. Throughout the                            procedure, the patient's blood pressure, pulse, and                            oxygen saturations were monitored continuously. The                            CF [id number] #[id number] was  introduced through the anus                            and advanced to the the cecum, identified by                            appendiceal orifice and ileocecal valve. The                            colonoscopy was performed without difficulty. The                            patient tolerated the procedure well. The quality                            of the bowel preparation was good. The ileocecal                            valve, appendiceal orifice, and rectum were                            photographed. Scope In: 8:52:12 AM Scope Out: 9:09:25 AM Scope Withdrawal Time: 0 hours 11 minutes 4 seconds  Total Procedure Duration: 0 hours 17 minutes 13 seconds  Findings:                 The perianal and digital rectal examinations were                            normal. Pertinent negatives include normal prostate                            (size, shape, and consistency).                           A 1 to 2 mm polyp was found in the sigmoid colon.                            The polyp was sessile. The polyp was removed with a  cold biopsy forceps. Resection and retrieval were                            complete. Verification of patient identification                            for the specimen was done. Estimated blood loss was                            minimal.                           The exam was otherwise without abnormality on                            direct and retroflexion views. Complications:            No immediate complications. Estimated Blood Loss:     Estimated blood loss was minimal. Impression:               - One 1 to 2 mm polyp in the sigmoid colon, removed                            with a cold biopsy forceps. Resected and retrieved.                           - The examination was otherwise normal on direct                            and retroflexion views. FHx CRCA father and                            maternal grandfather Recommendation:            - Patient has a contact number available for                            emergencies. The signs and symptoms of potential                            delayed complications were discussed with the                            patient. Return to normal activities tomorrow.                            Written discharge instructions were provided to the                            patient.                           - Resume previous diet.                           - Continue present medications.                           -  Await pathology results.                           - Repeat colonoscopy is recommended. The                            colonoscopy date will be determined after pathology                            results from today's exam become available for                            review. Iva Boop, MD 06/05/2023 9:17:16 AM This report has been signed electronically.

## 2023-06-05 NOTE — Progress Notes (Signed)
 VS by KD  Pt's states no medical or surgical changes since previsit or office visit.

## 2023-06-05 NOTE — Progress Notes (Signed)
Called to room to assist during endoscopic procedure.  Patient ID and intended procedure confirmed with present staff. Received instructions for my participation in the procedure from the performing physician.  

## 2023-06-05 NOTE — Progress Notes (Signed)
Uneventful anesthetic. Report to pacu rn. Vss. Care resumed by rn. 

## 2023-06-09 ENCOUNTER — Telehealth: Payer: Self-pay | Admitting: *Deleted

## 2023-06-09 NOTE — Telephone Encounter (Signed)
Phoned patient back  and answered question about his colonoscopy findings. Patient verbalized understanding.

## 2023-06-09 NOTE — Telephone Encounter (Signed)
Left message on f/u call 

## 2023-06-09 NOTE — Telephone Encounter (Signed)
Patient called states he is fine but has questions on what the doctor had discussed with him after the procedure.

## 2023-06-11 ENCOUNTER — Encounter: Payer: Self-pay | Admitting: Internal Medicine

## 2023-06-11 DIAGNOSIS — Z86010 Personal history of colonic polyps: Secondary | ICD-10-CM | POA: Insufficient documentation

## 2023-06-11 DIAGNOSIS — Z860101 Personal history of adenomatous and serrated colon polyps: Secondary | ICD-10-CM

## 2023-06-11 DIAGNOSIS — Z800 Family history of malignant neoplasm of digestive organs: Secondary | ICD-10-CM

## 2023-06-11 HISTORY — DX: Personal history of adenomatous and serrated colon polyps: Z86.0101

## 2023-06-15 ENCOUNTER — Telehealth: Payer: Self-pay | Admitting: Internal Medicine

## 2023-06-15 NOTE — Telephone Encounter (Signed)
PT is calling to discuss results of colonoscopy. Please advise.

## 2023-06-15 NOTE — Telephone Encounter (Signed)
Pt requested recent results from procedure.  Pt was made aware of the letter that was created by Dr. Leone Payor. Letter was  read aloud to pt. 5 year Colon recall placed in Epic. Pt made aware. Letter printed and sent to pt via mail. Pt made aware. Pt verbalized understanding with all questions answered.

## 2023-07-03 ENCOUNTER — Inpatient Hospital Stay: Payer: BC Managed Care – PPO | Attending: Hematology and Oncology

## 2023-07-03 ENCOUNTER — Inpatient Hospital Stay (HOSPITAL_BASED_OUTPATIENT_CLINIC_OR_DEPARTMENT_OTHER): Payer: BC Managed Care – PPO | Admitting: Hematology and Oncology

## 2023-07-03 VITALS — BP 118/75 | HR 56 | Temp 97.5°F | Resp 16 | Wt 149.4 lb

## 2023-07-03 DIAGNOSIS — D801 Nonfamilial hypogammaglobulinemia: Secondary | ICD-10-CM | POA: Diagnosis not present

## 2023-07-03 LAB — CBC WITH DIFFERENTIAL (CANCER CENTER ONLY)
Abs Immature Granulocytes: 0 10*3/uL (ref 0.00–0.07)
Basophils Absolute: 0.1 10*3/uL (ref 0.0–0.1)
Basophils Relative: 2 %
Eosinophils Absolute: 0.1 10*3/uL (ref 0.0–0.5)
Eosinophils Relative: 3 %
HCT: 41.4 % (ref 39.0–52.0)
Hemoglobin: 14.2 g/dL (ref 13.0–17.0)
Immature Granulocytes: 0 %
Lymphocytes Relative: 29 %
Lymphs Abs: 1 10*3/uL (ref 0.7–4.0)
MCH: 31.8 pg (ref 26.0–34.0)
MCHC: 34.3 g/dL (ref 30.0–36.0)
MCV: 92.6 fL (ref 80.0–100.0)
Monocytes Absolute: 0.3 10*3/uL (ref 0.1–1.0)
Monocytes Relative: 9 %
Neutro Abs: 2 10*3/uL (ref 1.7–7.7)
Neutrophils Relative %: 57 %
Platelet Count: 167 10*3/uL (ref 150–400)
RBC: 4.47 MIL/uL (ref 4.22–5.81)
RDW: 12.6 % (ref 11.5–15.5)
WBC Count: 3.4 10*3/uL — ABNORMAL LOW (ref 4.0–10.5)
nRBC: 0 % (ref 0.0–0.2)

## 2023-07-03 LAB — CMP (CANCER CENTER ONLY)
ALT: 20 U/L (ref 0–44)
AST: 30 U/L (ref 15–41)
Albumin: 4.6 g/dL (ref 3.5–5.0)
Alkaline Phosphatase: 47 U/L (ref 38–126)
Anion gap: 5 (ref 5–15)
BUN: 18 mg/dL (ref 6–20)
CO2: 31 mmol/L (ref 22–32)
Calcium: 9.5 mg/dL (ref 8.9–10.3)
Chloride: 104 mmol/L (ref 98–111)
Creatinine: 0.87 mg/dL (ref 0.61–1.24)
GFR, Estimated: >60 mL/min (ref >60)
Glucose, Bld: 76 mg/dL (ref 70–99)
Potassium: 4.1 mmol/L (ref 3.5–5.1)
Sodium: 140 mmol/L (ref 135–145)
Total Bilirubin: 0.8 mg/dL (ref 0.3–1.2)
Total Protein: 6.9 g/dL (ref 6.5–8.1)

## 2023-07-03 LAB — LACTATE DEHYDROGENASE: LDH: 123 U/L (ref 98–192)

## 2023-07-03 NOTE — Progress Notes (Signed)
[facility] [phone]   [phone]  INITIAL CONSULT NOTE  Patient Care Team: Mosetta Putt, MD as PCP - General (Family Medicine)  Hematological/Oncological History # Low Gamma Globulins  06/09/2023: Gamma globulin 0.7 (nml 0.8-1.7), Beta 1 globulin 0.3 (nml 0.4-0.6) 07/03/2023: establish care with Dr. Leonides Schanz   CHIEF COMPLAINTS/PURPOSE OF CONSULTATION:  " Low Gamma Globulins  "  HISTORY OF PRESENTING ILLNESS:  Justin Curtis 57 y.o. 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   On review of the previous records Mr. Debruin had labs drawn on 06/09/2023 which showed gammaglobulins of 0.7 and beta 1 globulin 0.3.  These are 0.1 below the reference range both.  Due to concern for these findings the patient was referred to hematology for further evaluation and management.  On exam today Mr. Ayoub reports that he is concerned about the trend of his proteins.  He reports that he rarely gets sick though he has had sinus infections before in the past.  He does not believe he had a COVID infection.  He is also not prone to any urinary tract infections or GI upsets.  He reports that he has been on ibuprofen for many years and is concerned that may also be a cause.  He also used to drink 1 glass of wine 4-5 nights per week but stopped that as well due to concern for these recent findings.  On further discussion he reports his mother had many ailments including kidney disease and frequent infections.  She also had breast cancer.  His father had colon cancer in his maternal grandfather had colon cancer.  He has 2 children both of whom are healthy.  He reports he personally did have melanoma status post resection.  He is a never smoker and usually drinks 4 to 5 glasses of wine per week but is not doing that since this recent finding.  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  He notes that he has been doing his best to exercise and try to eat healthy.  He otherwise denies any fevers, chills, sweats, nausea, vomiting or diarrhea.  A full 10 point ROS is otherwise negative.  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   Sleep apnea    no CPAP   Transient disorientation     SURGICAL HISTORY: Past Surgical History:  Procedure Laterality Date   COLONOSCOPY  2019   CG-MAC-miralax(exc)-normal- 5 yr recall d/t   left ACL repair Left 1988   LIPOMA EXCISION  2004   on his back   MELANOMA EXCISION Left    tricep area    SOCIAL HISTORY: Social History   Socioeconomic History   Marital status: Married    Spouse name: Not on file   Number of children: Not on file   Years of education: Not on file   Highest education level: Not on file  Occupational History   Not on file  Tobacco Use   Smoking status: Never   Smokeless tobacco: Never  Vaping Use   Vaping status: Never Used  Substance and Sexual Activity   Alcohol use: Yes    Alcohol/week: 10.0 standard drinks of alcohol    Types: 10 Standard drinks or equivalent per week    Comment: occasional wine or beer.   Drug use: No   Sexual activity: Not on file  Other Topics Concern   Not on file  Social History Narrative   Not on file   Social Determinants of Health   Financial Resource Strain: Not on file  Food Insecurity: No Food Insecurity (07/03/2023)   Hunger Vital Sign    Worried About Running Out of Food in the Last Year: Never true    Ran Out of Food in the Last Year: Never true  Transportation Needs: No  Transportation Needs (07/03/2023)   PRAPARE - Administrator, Civil Service (Medical): No    Lack of Transportation (Non-Medical): No  Physical Activity: Not on file  Stress: Not on file  Social Connections: Not on file  Intimate Partner Violence: Not At Risk (07/03/2023)   Humiliation, Afraid, Rape, and Kick questionnaire    Fear of Current or Ex-Partner: No    Emotionally Abused: No    Physically Abused: No    Sexually Abused: No    FAMILY HISTORY: Family History  Problem Relation Age of Onset   Hypertension Mother    Gout Mother    Hyperlipidemia Mother    Skin cancer Mother    Other Mother        renovascular disease   Colon polyps Father 67   Colon cancer Father 70   Skin cancer Father    Alzheimer's disease Father        Possibly    Skin cancer Sister    Sleep apnea Brother    Depression Brother    Colon cancer Maternal Grandfather    Esophageal cancer Neg Hx    Liver cancer Neg Hx    Pancreatic cancer Neg Hx    Rectal cancer Neg Hx    Stomach cancer Neg Hx     ALLERGIES:  has No Known Allergies.  MEDICATIONS:  Current Outpatient Medications  Medication Sig Dispense Refill   ALPRAZolam (XANAX) 0.5 MG tablet Take 0.25-0.5 mg by mouth at bedtime as needed.     Azelaic Acid 15 % cream Apply topically 2 (two) times daily.     Eszopiclone 3 MG TABS Take 3 mg by mouth at bedtime as needed.     finasteride (PROSCAR) 5 MG tablet Take 5 mg by mouth daily.     ibuprofen (ADVIL,MOTRIN) 400 MG tablet Take 400 mg by mouth every 6 (six) hours as needed.     loratadine (CLARITIN) 10 MG tablet Take 10 mg by mouth daily.     No current facility-administered medications for this visit.    REVIEW OF SYSTEMS:   Constitutional: ( - ) fevers, ( - )  chills , ( - ) night sweats Eyes: ( - ) blurriness of vision, ( - ) double vision, ( - ) watery eyes Ears, nose, mouth, throat, and face: ( - ) mucositis, ( - ) sore throat Respiratory: ( - ) cough, ( - ) dyspnea, ( - )  wheezes Cardiovascular: ( - ) palpitation, ( - ) chest discomfort, ( - ) lower extremity swelling Gastrointestinal:  ( - ) nausea, ( - ) heartburn, ( - ) change in  bowel habits Skin: ( - ) abnormal skin rashes Lymphatics: ( - ) new lymphadenopathy, ( - ) easy bruising Neurological: ( - ) numbness, ( - ) tingling, ( - ) new weaknesses Behavioral/Psych: ( - ) mood change, ( - ) new changes  All other systems were reviewed with the patient and are negative.  PHYSICAL EXAMINATION:  Vitals:   07/03/23 1347  BP: 118/75  Pulse: (!) 56  Resp: 16  Temp: (!) 97.5 F (36.4 C)  SpO2: 100%   Filed Weights   07/03/23 1347  Weight: 149 lb 6.4 oz (67.8 kg)    GENERAL: well appearing middle-aged Caucasian male in NAD  SKIN: skin color, texture, turgor are normal, no rashes or significant lesions EYES: conjunctiva are pink and non-injected, sclera clear LUNGS: clear to auscultation and percussion with normal breathing effort HEART: regular rate & rhythm and no murmurs and no lower extremity edema Musculoskeletal: no cyanosis of digits and no clubbing  PSYCH: alert & oriented x 3, fluent speech NEURO: no focal motor/sensory deficits  LABORATORY DATA:  I have reviewed the data as listed    Latest Ref Rng & Units 07/03/2023    2:28 PM  CBC  WBC 4.0 - 10.5 K/uL 3.4   Hemoglobin 13.0 - 17.0 g/dL 16.1   Hematocrit 09.6 - 52.0 % 41.4   Platelets 150 - 400 K/uL 167         No data to display           ASSESSMENT & PLAN Chasen Mendell 57 y.o. 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  # Low Gamma Globulins  -- Will assess his immunoglobulins and a full protein profile with multiple myeloma panel and  serum free light chains.  Will also order beta-2 microglobulin. -- Will order baseline CBC CMP and LDH. 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 --Fortunately the patient is not having any signs or symptoms concerning for immunodeficiency such as frequent or recurrent infections. -- Plan to have the patient return to clinic pending the results of the above studies.  Orders Placed This Encounter  Procedures   CBC with Differential (Cancer Center Only)    Standing Status:   Future    Number of Occurrences:   1    Standing Expiration Date:   07/02/2024   CMP (Cancer Center only)    Standing Status:   Future    Number of Occurrences:   1    Standing Expiration Date:   07/02/2024   Multiple Myeloma Panel (SPEP&IFE w/QIG)    Standing Status:   Future    Number of Occurrences:   1    Standing Expiration Date:   07/02/2024   Kappa/lambda light chains    Standing Status:   Future    Number of Occurrences:   1    Standing Expiration Date:   07/02/2024   Beta 2 microglobulin    Standing Status:   Future    Number of Occurrences:   1    Standing Expiration Date:   07/02/2024   Lactate dehydrogenase (LDH)    Standing Status:   Future    Number of Occurrences:   1    Standing Expiration Date:  07/02/2024    All questions were answered. The patient knows to call the clinic with any problems, questions or concerns.  A total of more than 60 minutes were spent on this encounter with face-to-face time and non-face-to-face time, including preparing to see the patient, ordering tests and/or medications, counseling the patient and coordination of care as outlined above.   Ulysees Barns, MD Department of Hematology/Oncology [facility] [place] Phone: [phone] Pager: [phone] Email: [email or web address]  07/03/2023 3:04 PM

## 2023-07-05 LAB — BETA 2 MICROGLOBULIN, SERUM: Beta-2 Microglobulin: 1.2 mg/L (ref 0.6–2.4)

## 2023-07-06 LAB — KAPPA/LAMBDA LIGHT CHAINS
Kappa free light chain: 9.6 mg/L (ref 3.3–19.4)
Kappa, lambda light chain ratio: 1.3 (ref 0.26–1.65)
Lambda free light chains: 7.4 mg/L (ref 5.7–26.3)

## 2023-07-07 LAB — MULTIPLE MYELOMA PANEL, SERUM
Albumin SerPl Elph-Mcnc: 4.1 g/dL (ref 2.9–4.4)
Albumin/Glob SerPl: 1.8 — ABNORMAL HIGH (ref 0.7–1.7)
Alpha 1: 0.2 g/dL (ref 0.0–0.4)
Alpha2 Glob SerPl Elph-Mcnc: 0.6 g/dL (ref 0.4–1.0)
B-Globulin SerPl Elph-Mcnc: 0.8 g/dL (ref 0.7–1.3)
Gamma Glob SerPl Elph-Mcnc: 0.8 g/dL (ref 0.4–1.8)
Globulin, Total: 2.4 g/dL (ref 2.2–3.9)
IgA: 94 mg/dL (ref 90–386)
IgG (Immunoglobin G), Serum: 798 mg/dL (ref 603–1613)
IgM (Immunoglobulin M), Srm: 85 mg/dL (ref 20–172)
Total Protein ELP: 6.5 g/dL (ref 6.0–8.5)

## 2023-07-09 ENCOUNTER — Telehealth: Payer: Self-pay | Admitting: *Deleted

## 2023-07-09 NOTE — Telephone Encounter (Signed)
-----   Message from Ulysees Barns IV sent at 07/08/2023  2:14 PM EDT ----- Please let Justin Curtis know that his blood work was normal.  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  Normal is 90 and above and he was at 94.  Additionally the rest of our protein assessment did not show that he was lacking in any proteins.  His immune system does appear fully functional.  At this time there is no need for any intervention as his protein levels appear to have normalized.  As long as he continues a regular diet I do not suspect that this will be an issue in the future.  We will see him back on an as-needed basis moving forward. ----- Message ----- From: Interface, Lab In [place] Sent: 07/03/2023   2:46 PM EDT To: Jaci Standard, MD

## 2023-07-09 NOTE — Telephone Encounter (Signed)
Received call back from pt. Reviewed lab results and Dr. Derek Mound assessment these lab results.  See prior message. Pt voiced understanding.

## 2023-07-09 NOTE — Telephone Encounter (Signed)
TCT patient regarding recent lab results. No answer but was able to leave vm message for pt to call back @ his convenience. We will advise that his blood work was normal. 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 Normal is 90 and above and he was at 94. Additionally the rest of our protein assessment did not show that he was lacking in any proteins. His immune system does appear fully functional. At this time there is no need for any intervention as his protein levels appear to have normalized. As long as he continues a regular diet I do not suspect that this will be an issue in the future. We will see him back on an as-needed basis moving forward.
